# Patient Record
Sex: Male | Born: 1990 | Race: White | Hispanic: No | Marital: Single | Smoking: Never smoker
Health system: Southern US, Community
[De-identification: ages and names within clinical notes are randomized; demographics above are authoritative.]

## PROBLEM LIST (undated history)

## (undated) DIAGNOSIS — J302 Other seasonal allergic rhinitis: Secondary | ICD-10-CM

## (undated) DIAGNOSIS — I493 Ventricular premature depolarization: Principal | ICD-10-CM

## (undated) HISTORY — PX: WISDOM TOOTH EXTRACTION: SHX21

## (undated) HISTORY — DX: Ventricular premature depolarization: I49.3

## (undated) HISTORY — DX: Other seasonal allergic rhinitis: J30.2

## (undated) HISTORY — PX: APPENDECTOMY: SHX54

## (undated) HISTORY — PX: TONSILLECTOMY: SUR1361

---

## 2010-06-30 ENCOUNTER — Emergency Department: Payer: Self-pay | Admitting: Internal Medicine

## 2012-07-06 LAB — CBC
HCT: 46.4 % (ref 40.0–52.0)
HGB: 16.4 g/dL (ref 13.0–18.0)
MCH: 29.8 pg (ref 26.0–34.0)
MCHC: 35.3 g/dL (ref 32.0–36.0)
Platelet: 310 10*3/uL (ref 150–440)
RBC: 5.49 10*6/uL (ref 4.40–5.90)
RDW: 12.8 % (ref 11.5–14.5)
WBC: 5.7 10*3/uL (ref 3.8–10.6)

## 2012-07-06 LAB — COMPREHENSIVE METABOLIC PANEL
Albumin: 4.8 g/dL (ref 3.4–5.0)
Alkaline Phosphatase: 80 U/L (ref 50–136)
Anion Gap: 3 — ABNORMAL LOW (ref 7–16)
Bilirubin,Total: 1 mg/dL (ref 0.2–1.0)
Calcium, Total: 9.6 mg/dL (ref 8.5–10.1)
Creatinine: 0.99 mg/dL (ref 0.60–1.30)
EGFR (African American): >60
EGFR (Non-African Amer.): >60
Osmolality: 278 (ref 275–301)
Potassium: 4.1 mmol/L (ref 3.5–5.1)
SGPT (ALT): 23 U/L (ref 12–78)
Sodium: 139 mmol/L (ref 136–145)

## 2012-07-06 LAB — URINALYSIS, COMPLETE
Bacteria: NONE SEEN
Bilirubin,UR: NEGATIVE
Leukocyte Esterase: NEGATIVE
Nitrite: NEGATIVE
RBC,UR: 4 /HPF (ref 0–5)
Specific Gravity: 1.018 (ref 1.003–1.030)
Squamous Epithelial: NONE SEEN

## 2012-07-06 LAB — LIPASE, BLOOD: Lipase: 107 U/L (ref 73–393)

## 2012-07-07 ENCOUNTER — Observation Stay: Payer: Self-pay | Admitting: Surgery

## 2012-07-07 LAB — CBC WITH DIFFERENTIAL/PLATELET
Basophil #: 0.1 10*3/uL (ref 0.0–0.1)
Basophil %: 1.7 %
Eosinophil #: 0.1 10*3/uL (ref 0.0–0.7)
HCT: 39.1 % — ABNORMAL LOW (ref 40.0–52.0)
HGB: 14 g/dL (ref 13.0–18.0)
Lymphocyte #: 2.2 10*3/uL (ref 1.0–3.6)
Lymphocyte %: 35.9 %
MCV: 85 fL (ref 80–100)
Monocyte %: 6.1 %
Neutrophil #: 3.3 10*3/uL (ref 1.4–6.5)
Platelet: 240 10*3/uL (ref 150–440)
RBC: 4.61 10*6/uL (ref 4.40–5.90)
WBC: 6.1 10*3/uL (ref 3.8–10.6)

## 2013-09-09 ENCOUNTER — Emergency Department (HOSPITAL_COMMUNITY)
Admission: EM | Admit: 2013-09-09 | Discharge: 2013-09-09 | Disposition: A | Discharge status: Home / Self Care | Payer: BC Managed Care – PPO | Source: Home / Self Care

## 2013-09-09 ENCOUNTER — Encounter (HOSPITAL_COMMUNITY): Payer: Self-pay | Admitting: Emergency Medicine

## 2013-09-09 ENCOUNTER — Emergency Department (INDEPENDENT_AMBULATORY_CARE_PROVIDER_SITE_OTHER): Payer: BC Managed Care – PPO

## 2013-09-09 ENCOUNTER — Emergency Department (HOSPITAL_COMMUNITY): Payer: BC Managed Care – PPO

## 2013-09-09 DIAGNOSIS — M899 Disorder of bone, unspecified: Secondary | ICD-10-CM

## 2013-09-09 DIAGNOSIS — M949 Disorder of cartilage, unspecified: Secondary | ICD-10-CM

## 2013-09-09 DIAGNOSIS — M25512 Pain in left shoulder: Secondary | ICD-10-CM

## 2013-09-09 NOTE — Discharge Instructions (Signed)

## 2013-09-09 NOTE — ED Provider Notes (Signed)
CSN: [id number]     Arrival date & time 09/09/13  1156 History   First MD Initiated Contact with Patient 09/09/13 1231     Chief Complaint  Patient presents with  . Pain   (Consider location/radiation/quality/duration/timing/severity/associated sxs/prior Treatment) HPI Comments: 23 year old male complaining of pain to the left clavicle for approximately 3 weeks. This began a couple days after playing around the golf. He denies any known injury or trauma. Pain is exacerbated by placing weight on the left arm or pulling the left arm down. Denies pain to the a.c. joint. He points to an area across the mid clavicle. No pain with rotation of the head or neck.   History reviewed. No pertinent past medical history. No past surgical history on file. No family history on file. History  Substance Use Topics  . Smoking status: Not on file  . Smokeless tobacco: Not on file  . Alcohol Use: Not on file    Review of Systems  Constitutional: Negative.   Respiratory: Negative.   Gastrointestinal: Negative.   Genitourinary: Negative.   Musculoskeletal: Negative for back pain, gait problem, myalgias and neck pain.       As per HPI  Skin: Negative.   Neurological: Negative for dizziness, weakness, numbness and headaches.    Allergies  Review of patient's allergies indicates no known allergies.  Home Medications   Prior to Admission medications   Not on File   BP 129/89  Pulse 82  Temp(Src) 98 F (36.7 C) (Oral)  Resp 12  SpO2 99% Physical Exam  Nursing note and vitals reviewed. Constitutional: He is oriented to person, place, and time. He appears well-developed and well-nourished. No distress.  Eyes: Conjunctivae and EOM are normal.  Neck: Normal range of motion. Neck supple.  Cardiovascular: Normal rate and regular rhythm.   Pulmonary/Chest: Effort normal and breath sounds normal.  Musculoskeletal:  Minor redness and swelling across the mid clavicle. No direct bony tenderness or  percussion tenderness. No deformity. No signs of infection. Full range of motion of the left upper extremity. Distal neurovascular motor sensory of the left upper extremity is intact. Left radial pulse intact. No swelling or tenderness over the a.c. joint. Sternum intact without swelling, tenderness or discoloration. No chest tenderness. No pain with taking a deep breath.  Lymphadenopathy:    He has no cervical adenopathy.  Neurological: He is alert and oriented to person, place, and time. He exhibits normal muscle tone.  Skin: Skin is warm and dry.  Psychiatric: He has a normal mood and affect.    ED Course  Procedures (including critical care time) Labs Review Labs Reviewed - No data to display  Imaging Review Dg Clavicle Left  09/09/2013   CLINICAL DATA:  Left collarbone pain for 3 weeks, no known injury  EXAM: LEFT CLAVICLE - 2+ VIEWS  COMPARISON:  None.  FINDINGS: No fracture or dislocation. No definite aggressive osseous lesions. Limited visualization of the adjacent acromioclavicular joint is normal. Normal coracoclavicular distance is observed on both provided radiographs. Regional soft tissues appear normal. Limited visualization of the left lung apex is normal.  IMPRESSION: Normal radiographs of the left clavicle.   Electronically Signed   By: Simonne ComeJohn  Watts M.D.   On: 09/09/2013 13:05     MDM   1. Clavicle pain, left    Subclavius muscle strain/sprain; probably due to golf maneuvers Ice, sling for 3-4 days. Remove for stretching periodically. F/U with PCP as needed or sports medicine     Onalee Huaavid  Mabe, NP 09/09/13 1325  Hayden Rasmussenavid Mabe, NP 09/09/13 1338

## 2013-09-09 NOTE — ED Notes (Signed)
C/o collar bone pain for three weeks now States he was playing golf  Denies any injury States area is red

## 2013-09-09 NOTE — ED Provider Notes (Signed)
Medical screening examination/treatment/procedure(s) were performed by resident physician or non-physician practitioner and as supervising physician I was immediately available for consultation/collaboration.   KINDL,JAMES DOUGLAS MD.   James D Kindl, MD 09/09/13 1514 

## 2013-09-15 ENCOUNTER — Encounter: Payer: Self-pay | Admitting: Family Medicine

## 2013-09-15 ENCOUNTER — Ambulatory Visit (INDEPENDENT_AMBULATORY_CARE_PROVIDER_SITE_OTHER): Payer: BC Managed Care – PPO | Admitting: Family Medicine

## 2013-09-15 VITALS — BP 120/75 | HR 79 | Ht 71.0 in | Wt 165.0 lb

## 2013-09-15 DIAGNOSIS — M949 Disorder of cartilage, unspecified: Secondary | ICD-10-CM

## 2013-09-15 DIAGNOSIS — M25519 Pain in unspecified shoulder: Secondary | ICD-10-CM

## 2013-09-15 DIAGNOSIS — M899 Disorder of bone, unspecified: Secondary | ICD-10-CM

## 2013-09-15 DIAGNOSIS — M25512 Pain in left shoulder: Secondary | ICD-10-CM

## 2013-09-15 DIAGNOSIS — M25511 Pain in right shoulder: Secondary | ICD-10-CM

## 2013-09-15 NOTE — Patient Instructions (Signed)
Your history and exam are consistent with strain of your scalene muscles. Icing 15 minutes at a time 3-4 times a day as needed. Ok for activities as tolerated. Tylenol or aleve as needed for pain. Physical therapy is your best bet to recover from this - do home exercises and stretches they give you daily for next 6 weeks. Follow up with me in 6 weeks.

## 2013-09-17 ENCOUNTER — Encounter: Payer: Self-pay | Admitting: Family Medicine

## 2013-09-17 DIAGNOSIS — M25512 Pain in left shoulder: Secondary | ICD-10-CM | POA: Insufficient documentation

## 2013-09-17 NOTE — Progress Notes (Signed)
Patient ID: Bryan LoftsStephen Statzer, male   DOB: Sep 08, 1990, 23 y.o.   MRN: [id number]  PCP: No primary provider on file.  Subjective:   HPI: Patient is a 23 y.o. male here for left shoulder pain.  Patient reports he was playing golf 3 weeks ago. No known injury. A day or so after this he felt pain localized to deep to collarbone on left side. Is right handed. Some slight swelling. Feels pressure in this area with arm above head. Worse sleeping. No numbness or tingling. No radiation. Tried advil, icing. Slightly better over past week.  History reviewed. No pertinent past medical history.  No current outpatient prescriptions on file prior to visit.   No current facility-administered medications on file prior to visit.    Past Surgical History  Procedure Laterality Date  . Tonsillectomy    . Appendectomy      No Known Allergies  History   Social History  . Marital Status: Single    Spouse Name: N/A    Number of Children: N/A  . Years of Education: N/A   Occupational History  . Not on file.   Social History Main Topics  . Smoking status: Never Smoker   . Smokeless tobacco: Not on file  . Alcohol Use: Not on file  . Drug Use: Not on file  . Sexual Activity: Not on file   Other Topics Concern  . Not on file   Social History Narrative  . No narrative on file    No family history on file.  BP 120/75  Pulse 79  Ht 5\' 11"  (1.803 m)  Wt 165 lb (74.844 kg)  BMI 23.02 kg/m2  Review of Systems: See HPI above.    Objective:  Physical Exam:  Gen: NAD  Left shoulder: No swelling, ecchymoses.  No gross deformity. No TTP focally on left clavicle - tenderness posterior to this, in scalene area. FROM shoulder with pain in same area doing pushup motion, full abduction. Negative Hawkins, Neers. Negative Speeds, Yergasons. Strength 5/5 with empty can and resisted internal/external rotation. Negative apprehension. NV intact distally.    Assessment & Plan:  1. Left  shoulder pain - very unusual presentation.  No injury, no tenderness of clavicle - radiographs negative.  Suspect scalene muscle strain based on history and location, pain with shrug.  No symptoms into left upper extremity.  Will try physical therapy, icing, activity modification.  F/u in 6 weeks.

## 2013-09-17 NOTE — Assessment & Plan Note (Signed)
very unusual presentation.  No injury, no tenderness of clavicle - radiographs negative.  Suspect scalene muscle strain based on history and location, pain with shrug.  No symptoms into left upper extremity.  Will try physical therapy, icing, activity modification.  F/u in 6 weeks.

## 2013-10-09 ENCOUNTER — Encounter: Payer: Self-pay | Admitting: Physician Assistant

## 2013-10-09 ENCOUNTER — Ambulatory Visit (INDEPENDENT_AMBULATORY_CARE_PROVIDER_SITE_OTHER): Payer: BC Managed Care – PPO | Admitting: Physician Assistant

## 2013-10-09 VITALS — BP 102/76 | HR 103 | Temp 98.1°F | Resp 16 | Ht 71.0 in | Wt 159.8 lb

## 2013-10-09 DIAGNOSIS — Z113 Encounter for screening for infections with a predominantly sexual mode of transmission: Secondary | ICD-10-CM

## 2013-10-09 DIAGNOSIS — R002 Palpitations: Secondary | ICD-10-CM

## 2013-10-09 LAB — CBC
HCT: 40.7 % (ref 39.0–52.0)
HEMOGLOBIN: 14.7 g/dL (ref 13.0–17.0)
MCH: 29.3 pg (ref 26.0–34.0)
MCHC: 36.1 g/dL — ABNORMAL HIGH (ref 30.0–36.0)
MCV: 81.1 fL (ref 78.0–100.0)
Platelets: 240 10*3/uL (ref 150–400)
RBC: 5.02 MIL/uL (ref 4.22–5.81)
RDW: 13 % (ref 11.5–15.5)
WBC: 3.6 10*3/uL — ABNORMAL LOW (ref 4.0–10.5)

## 2013-10-09 LAB — COMPREHENSIVE METABOLIC PANEL
ALBUMIN: 4.7 g/dL (ref 3.5–5.2)
ALK PHOS: 37 U/L — AB (ref 39–117)
ALT: 11 U/L (ref 0–53)
AST: 20 U/L (ref 0–37)
BUN: 14 mg/dL (ref 6–23)
CO2: 29 mEq/L (ref 19–32)
CREATININE: 0.99 mg/dL (ref 0.50–1.35)
Calcium: 9.5 mg/dL (ref 8.4–10.5)
Chloride: 102 mEq/L (ref 96–112)
GLUCOSE: 95 mg/dL (ref 70–99)
Potassium: 4.3 mEq/L (ref 3.5–5.3)
Sodium: 140 mEq/L (ref 135–145)
Total Bilirubin: 0.8 mg/dL (ref 0.2–1.2)
Total Protein: 7.1 g/dL (ref 6.0–8.3)

## 2013-10-09 NOTE — Progress Notes (Signed)
Pre visit review using our clinic review tool, if applicable. No additional management support is needed unless otherwise documented below in the visit note/SLS  

## 2013-10-09 NOTE — Patient Instructions (Addendum)
Please obtain labs.  I will call you with your results.  Avoid caffeine.  You will be contacted by Cardiology for placement of a Holter Monitor.  We will call you once we have those results.  We will also get you in to see a Cardiologist for a consult.  Read information below on HPV.  I would still recommend the vaccination.  Human Papillomavirus Human papillomavirus (HPV) is the most common sexually transmitted infection (STI) and is highly contagious. HPV infections cause genital warts and cancers to the outlet of the womb (cervix), birth canal (vagina), opening of the birth canal (vulva), and anus. There are over 100 types of HPV. Four types of HPV are responsible for causing 70% of all cervical cancers. Ninety percent of anal cancers and genital warts are caused by HPV. Unless you have wart-like lesions in the throat or genital warts that you can see or feel, HPV usually does not cause symptoms. Therefore, people can be infected for long periods and pass it on to others without knowing it. HPV in pregnancy usually does not cause a problem for the mother or baby. If the mother has genital warts, the baby rarely gets infected. When the HPV infection is found to be pre-cancerous on the cervix, vagina, or vulva, the mother will be followed closely during the pregnancy. Any needed treatment will be done after the baby is born. CAUSES   Having unprotected sex. HPV can be spread by oral, vaginal, or anal sexual activity.  Having several sex partners.  Having a sex partner who has other sex partners.  Having or having had another sexually transmitted infection. SYMPTOMS   More than 90% of people carrying HPV cannot tell anything is wrong.  Wart-like lesions in the throat (from having oral sex).  Warts in the infected skin or mucous membranes.  Genital warts may itch, burn, or bleed.  Genital warts may be painful or bleed during sexual intercourse. DIAGNOSIS   Genital warts are easily seen  with the naked eye.  Currently, there is no FDA-approved test to detect HPV in males.  In females, a Pap test can show cells which are infected with HPV.  In females, a scope can be used to view the cervix (colposcopy). A colposcopy can be performed if the pelvic exam or Pap test is abnormal.  In females, a sample of tissue may be removed (biopsy) during the colposcopy. TREATMENT   Treatment of genital warts can include:  Podophyllin lotion or gel.  Bichloroacetic acid (BCA) or trichloroacetic acid (TCA).  Podofilox solution or gel.  Imiquimod cream.  Interferon injections.  Use of a probe to apply extreme cold (cryotherapy).  Application of an intense beam of light (laser treatment).  Use of a probe to apply extreme heat (electrocautery).  Surgery.  HPV of the cervix, vagina, or vulva can be treated with:  Cryotherapy.  Laser treatment.  Electrocautery.  Surgery. Your caregiver will follow you closely after you are treated. This is because the HPV can come back and may need treatment again. HOME CARE INSTRUCTIONS   Follow your caregiver's instructions regarding medications, Pap tests, and follow-up exams.  Do not touch or scratch the warts.  Do not treat genital warts with medication used for treating hand warts.  Tell your sex partner about your infection because he or she may also need treatment.  Do not have sex while you are being treated.  After treatment, use condoms during sex to prevent future infections.  Have only 1  sex partner.  Have a sex partner who does not have other sex partners.  Use over-the-counter creams for itching or irritation as directed by your caregiver.  Use over-the-counter or prescription medicines for pain, discomfort, or fever as directed by your caregiver.  Do not douche or use tampons during treatment of HPV. PREVENTION   Talk to your caregiver about getting the HPV vaccines. These vaccines prevent some HPV infections  and cancers. It is recommended that the vaccine be given to males and females between the age of 77 and 37 years old. It will not work if you already have HPV and it is not recommended for pregnant women. The vaccines are not recommended for pregnant women.  Call your caregiver if you think you are pregnant and have the HPV.  A PAP test is done to screen for cervical cancer.  The first PAP test should be done at age 74.  Between ages 88 and 59, PAP tests are repeated every 2 years.  Beginning at age 25, you are advised to have a PAP test every 3 years as long as your past 3 PAP tests have been normal.  Some women have medical problems that increase the chance of getting cervical cancer. Talk to your caregiver about these problems. It is especially important to talk to your caregiver if a new problem develops soon after your last PAP test. In these cases, your caregiver may recommend more frequent screening and Pap tests.  The above recommendations are the same for women who have or have not gotten the vaccine for HPV (Human Papillomavirus).  If you had a hysterectomy for a problem that was not a cancer or a condition that could lead to cancer, then you no longer need Pap tests. However, even if you no longer need a PAP test, a regular exam is a good idea to make sure no other problems are starting.   If you are between ages 69 and 84, and you have had normal Pap tests going back 10 years, you no longer need Pap tests. However, even if you no longer need a PAP test, a regular exam is a good idea to make sure no other problems are starting.  If you have had past treatment for cervical cancer or a condition that could lead to cancer, you need Pap tests and screening for cancer for at least 20 years after your treatment.  If Pap tests have been discontinued, risk factors (such as a new sexual partner)need to be re-assessed to determine if screening should be resumed.  Some women may need  screenings more often if they are at high risk for cervical cancer. SEEK MEDICAL CARE IF:   The treated skin becomes red, swollen or painful.  You have an oral temperature above 102 F (38.9 C).  You feel generally ill.  You feel lumps or pimple-like projections in and around your genital area.  You develop bleeding of the vagina or the treatment area.  You develop painful sexual intercourse. Document Released: 05/27/2003 Document Revised: 05/29/2011 Document Reviewed: 06/11/2013 [facility] Patient Information 2015 [place], [place]. This information is not intended to replace advice given to you by your health care provider. Make sure you discuss any questions you have with your health care provider.

## 2013-10-09 NOTE — Assessment & Plan Note (Signed)
Will obtain routine STI labs for STI screening.  Physical exam unremarkable.

## 2013-10-09 NOTE — Assessment & Plan Note (Signed)
Long-standing history.  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  Will refer to Cardiology.  Will also set up Holter Monitor placement.  Will obtain labs to include CBC, BMP, TSH and T4.  Avoid caffeine.  If symptoms recur and are associated with SOB or chest pain, patient to proceed to ER for acute evaluation.

## 2013-10-09 NOTE — Progress Notes (Signed)
Patient presents to clinic today to establish care.  Acute Concerns: Patient complains of intermittent palpitations that have been occurring every few months for at least 5 years.  Has never had a formal evaluation.  Endorses racing heart beat and fluttering that lasts for several minutes and is associated with SOB.  Denies chest pain, nausea or vomiting.  Denies lightheadedness, dizziness or syncope.  Denies hx of cardiac disease.  States he has an identical twin brother who suffers the same symptoms.  Denies history of anxiety.  Symptoms seem more prevalent when patient is low on sleep.   Patient also requesting routine STD screening.  Is sexually active with women only.  Endorses monogamous relationship with his girlfriend.  Does not use condoms.   Has no concern for current STD.  Denies hx of STD.   Past Medical History  Diagnosis Date  . Seasonal allergies     Past Surgical History  Procedure Laterality Date  . Tonsillectomy    . Appendectomy    . Wisdom tooth extraction      No current outpatient prescriptions on file prior to visit.   No current facility-administered medications on file prior to visit.    Allergies  Allergen Reactions  . Prednisone     SOB    Family History  Problem Relation Age of Onset  . Healthy Mother   . Healthy Father   . Diabetes Maternal Grandmother   . Cancer Maternal Grandmother     lung  . Diabetes Maternal Aunt   . Diabetes Maternal Uncle   . Healthy Brother     x1 Twin    History   Social History  . Marital Status: Single    Spouse Name: N/A    Number of Children: N/A  . Years of Education: N/A   Occupational History  . Not on file.   Social History Main Topics  . Smoking status: Never Smoker   . Smokeless tobacco: Never Used  . Alcohol Use: 3.0 oz/week    5 Cans of beer per week  . Drug Use: Yes    Special: Marijuana     Comment: rare  . Sexual Activity: Yes     Comment: women - partner uses OCPs   Other Topics  Concern  . Not on file   Social History Narrative  . No narrative on file   ROS See HPI.  All other ROS are negative.  BP 102/76  Pulse 103  Temp(Src) 98.1 F (36.7 C) (Oral)  Resp 16  Ht 5\' 11"  (1.803 m)  Wt 159 lb 12 oz (72.462 kg)  BMI 22.29 kg/m2  SpO2 98%  Physical Exam  Vitals reviewed. Constitutional: He is oriented to person, place, and time and well-developed, well-nourished, and in no distress.  HENT:  Head: Normocephalic and atraumatic.  Right Ear: External ear normal.  Left Ear: External ear normal.  Nose: Nose normal.  Mouth/Throat: Oropharynx is clear and moist. No oropharyngeal exudate.  Eyes: Conjunctivae are normal. Pupils are equal, round, and reactive to light.  Neck: Neck supple. No thyromegaly present.  Cardiovascular: Normal rate, regular rhythm, normal heart sounds and intact distal pulses.   Pulmonary/Chest: Effort normal and breath sounds normal. No respiratory distress. He has no wheezes. He has no rales. He exhibits no tenderness.  Genitourinary: Testes/scrotum normal and penis normal. No discharge found.  Neurological: He is alert and oriented to person, place, and time.  Skin: Skin is warm and dry. No rash noted.  Psychiatric: Affect  normal.   Assessment/Plan: Palpitations Long-standing history.  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  Will refer to Cardiology.  Will also set up Holter Monitor placement.  Will obtain labs to include CBC, BMP, TSH and T4.  Avoid caffeine.  If symptoms recur and are associated with SOB or chest pain, patient to proceed to ER for acute evaluation.   Screening for STD (sexually transmitted disease) Will obtain routine STI labs for STI screening.  Physical exam unremarkable.

## 2013-10-10 LAB — GC/CHLAMYDIA PROBE AMP, URINE
CHLAMYDIA, SWAB/URINE, PCR: NEGATIVE
GC PROBE AMP, URINE: NEGATIVE

## 2013-10-10 LAB — HIV ANTIBODY (ROUTINE TESTING W REFLEX): HIV 1&2 Ab, 4th Generation: NONREACTIVE

## 2013-10-10 LAB — TSH: TSH: 1.56 u[IU]/mL (ref 0.350–4.500)

## 2013-10-10 LAB — T4: T4, Total: 9 ug/dL (ref 5.0–12.5)

## 2013-10-10 LAB — RPR

## 2013-10-13 LAB — HSV(HERPES SMPLX)ABS-I+II(IGG+IGM)-BLD
HSV 1 Glycoprotein G Ab, IgG: <0.1 IV
Herpes Simplex Vrs I&II-IgM Ab (EIA): 0.14 INDEX

## 2013-10-16 ENCOUNTER — Encounter (INDEPENDENT_AMBULATORY_CARE_PROVIDER_SITE_OTHER): Payer: BC Managed Care – PPO

## 2013-10-16 ENCOUNTER — Encounter: Payer: Self-pay | Admitting: *Deleted

## 2013-10-16 DIAGNOSIS — R002 Palpitations: Secondary | ICD-10-CM

## 2013-10-16 NOTE — Progress Notes (Signed)
Patient ID: Bryan LoftsStephen Christman, male   DOB: Oct 29, 1990, 23 y.o.   MRN: [id number] [facility] 24 hour holter monitor applied to patient.

## 2013-10-27 ENCOUNTER — Ambulatory Visit: Payer: BC Managed Care – PPO | Admitting: Family Medicine

## 2013-10-30 ENCOUNTER — Telehealth: Payer: Self-pay | Admitting: Physician Assistant

## 2013-10-30 NOTE — Telephone Encounter (Signed)
Holter Monitor results are in. I am not sure if Cardiology called patient with results are not.  He had a couple of episodes of sinus arrhythmia that correspond to his symptoms. I want him to take it easy. I am having the referral coordinator see if Cardiology can move up his appointment to a sooner date.   Candise BowensJen can you contact Cardiology and see about getting the patient a sooner appointment as his Holter Monitor results show correlation between findings and patients symptoms.

## 2013-10-31 NOTE — Telephone Encounter (Signed)
If there is nothing else available then that will be fine.

## 2013-10-31 NOTE — Telephone Encounter (Signed)
He has appt on 8/27, is that to far out?

## 2013-11-13 ENCOUNTER — Ambulatory Visit (INDEPENDENT_AMBULATORY_CARE_PROVIDER_SITE_OTHER): Payer: BC Managed Care – PPO | Admitting: Cardiovascular Disease

## 2013-11-13 ENCOUNTER — Encounter: Payer: Self-pay | Admitting: Cardiovascular Disease

## 2013-11-13 VITALS — BP 100/68 | HR 76 | Resp 16 | Ht 71.0 in | Wt 160.0 lb

## 2013-11-13 DIAGNOSIS — R0602 Shortness of breath: Secondary | ICD-10-CM

## 2013-11-13 MED ORDER — METOPROLOL SUCCINATE ER 25 MG PO TB24: 12.5000 mg | ORAL_TABLET | Freq: Every day | ORAL | Status: DC

## 2013-11-13 NOTE — Patient Instructions (Signed)
Your physician has requested that you have an echocardiogram. Echocardiography is a painless test that uses sound waves to create images of your heart. It provides your doctor with information about the size and shape of your heart and how well your heart's chambers and valves are working. This procedure takes approximately one hour. There are no restrictions for this procedure.  START Metoprolol ER  tablet - take 1/2 tablet daily.  Dr. Royann Shivers recommends that you schedule a follow-up appointment in: 3-4 months

## 2013-11-13 NOTE — Progress Notes (Signed)
Patient ID: Bryan Carrillo, male   DOB: 1990/06/01, 23 y.o.   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 They are described as frequent, but isolated thumping sensation in his chest associated with a sensation of breathlessness. Earlier in the year, during tax season he noticed they were particularly frequent and bothersome. He noticed a definite association between stress at work and the prevalence of the palpitations. Around that time he was often staying up until 3:00 in the morning to finish his increased work load. He has also noticed that they're more common when he increases the intake of caffeine or after consuming alcohol the night before. When the palpitations were very frequent in February and March they associated a sensation of constant chest pain. The palpitations occur randomly throughout the day but are never sustained. He has not had syncope or presyncope. He is a runner, although he does not exercise as often as he used to in the past. Nevertheless at least a couple of times a week he will run 2 x 8 minute miles. He does not experience the palpitations during exercise or immediately following exercise.  A 24-hour Holter monitor shows a high burden of isolated PVCs. PVCs represent roughly 7% of all beats. There was no evidence of couplets or ventricular tachycardia. He had normal circadian rhythm variation and profound sinus arrhythmia of youth. Of note today that he wore the monitor was not a day with prominent palpitations.   Allergies  Allergen Reactions  . Prednisone     SOB    Current Outpatient Prescriptions  Medication Sig Dispense Refill  . Multiple Vitamin (MULTIVITAMIN) tablet Take 1 tablet by mouth daily.      .       No current facility-administered medications for this visit.    Past Medical History  Diagnosis Date    . Seasonal allergies     Past Surgical History  Procedure Laterality Date  . Tonsillectomy    . Appendectomy    . Wisdom tooth extraction      Family History  Problem Relation Age of Onset  . Healthy Mother   . Healthy Father   . Diabetes Maternal Grandmother   . Cancer Maternal Grandmother     lung  . Diabetes Maternal Aunt   . Diabetes Maternal Uncle   . Healthy Brother     x1 Twin    History   Social History  . Marital Status: Single    Spouse Name: N/A    Number of Children: N/A  . Years of Education: N/A   Occupational History  . Not on file.   Social History Main Topics  . Smoking status: Never Smoker   . Smokeless tobacco: Never Used  . Alcohol Use: 3.0 oz/week    5 Cans of beer per week  . Drug Use: Yes    Special: Marijuana     Comment: rare  . Sexual Activity: Yes     Comment: women - partner uses OCPs   Other Topics Concern  . Not on file   Social History Narrative  . No narrative on file    Review of systems: The patient specifically denies any chest painwith exertion, dyspnea with exertion, orthopnea, paroxysmal nocturnal dyspnea, syncope, palpitations, focal neurological deficits, intermittent claudication, lower extremity edema, unexplained weight gain, cough, hemoptysis or wheezing.  The patient also denies  abdominal pain, nausea, vomiting, dysphagia, diarrhea, constipation, polyuria, polydipsia, dysuria, hematuria, frequency, urgency, abnormal bleeding or bruising, fever, chills, unexpected weight changes, mood swings, change in skin or hair texture, change in voice quality, auditory or visual problems, allergic reactions or rashes, new musculoskeletal complaints other than usual "aches and pains".   PHYSICAL EXAM BP 100/68  Pulse 76  Ht  (1.803 m)  Wt 160 lb (72.576 kg)  BMI 22.33 kg/m2  General: Alert, oriented x3, no distress Head: no evidence of trauma, PERRL, EOMI, no exophtalmos or lid lag, no myxedema, no xanthelasma;  normal ears, nose and oropharynx Neck: normal jugular venous pulsations and no hepatojugular reflux; brisk carotid pulses without delay and no carotid bruits Chest: clear to auscultation, no signs of consolidation by percussion or palpation, normal fremitus, symmetrical and full respiratory excursions Cardiovascular: normal position and quality of the apical impulse, regular rhythm with very prominent respiratory arrhythmia, normal first and second heart sounds, no murmurs, rubs or gallops Abdomen: no tenderness or distention, no masses by palpation, no abnormal pulsatility or arterial bruits, normal bowel sounds, no hepatosplenomegaly Extremities: no clubbing, cyanosis or edema; 2+ radial, ulnar and brachial pulses bilaterally; 2+ right femoral, posterior tibial and dorsalis pedis pulses; 2+ left femoral, posterior tibial and dorsalis pedis pulses; no subclavian or femoral bruits Neurological: grossly nonfocal   EKG: Sinus rhythm with respiratory arrhythmia, frequent PVCs, normal QTC (396 ms), no epsilon wave, no repolarization abnormalities  BMET    Component Value Date/Time   NA 140 10/09/2013 0935   K 4.3 10/09/2013 0935   CL 102 10/09/2013 0935   CO2 29 10/09/2013 0935   GLUCOSE 95 10/09/2013 0935   BUN 14 10/09/2013 0935   CREATININE 0.99 10/09/2013 0935   CALCIUM 9.5 10/09/2013 0935     ASSESSMENT AND PLAN  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 He does have some associated shortness of breath and chest discomfort, but these occur at rest when the arrhythmia is prevalent and do not occur during more intense physical exercise. He does not have high risk features on the electrocardiogram. We'll check an echocardiogram primarily for his symptoms of dyspnea and chest pain. Consider empirical therapy with beta blockers, but the dose will have to be very low since he tends to run a low normal blood pressure.  It is probably a good idea to do a "trial run" of beta blocker therapy now, to know that this will be a useful intervention next spring, when his symptoms are likely to worsen under the pressure of increased work load  Orders Placed This Encounter  Procedures  . 2D Echocardiogram without contrast   Meds ordered this encounter  Medications  . metoprolol succinate (TOPROL-XL) 25 MG 24 hr tablet    Sig: Take 0.5 tablets (12.5 mg total) by mouth daily.    Dispense:  15 tablet    Refill:  5    Lakara Weiland  Thurmon Fair, MD, [facility] [phone] office [phone] pager

## 2013-11-19 ENCOUNTER — Ambulatory Visit (HOSPITAL_COMMUNITY)
Admission: RE | Admit: 2013-11-19 | Discharge: 2013-11-19 | Disposition: A | Discharge status: Home / Self Care | Payer: BC Managed Care – PPO | Source: Ambulatory Visit | Attending: Cardiovascular Disease | Admitting: Cardiovascular Disease

## 2013-11-19 DIAGNOSIS — I359 Nonrheumatic aortic valve disorder, unspecified: Secondary | ICD-10-CM | POA: Diagnosis not present

## 2013-11-19 DIAGNOSIS — R0989 Other specified symptoms and signs involving the circulatory and respiratory systems: Secondary | ICD-10-CM | POA: Diagnosis not present

## 2013-11-19 DIAGNOSIS — I4949 Other premature depolarization: Secondary | ICD-10-CM

## 2013-11-19 DIAGNOSIS — R0609 Other forms of dyspnea: Secondary | ICD-10-CM | POA: Diagnosis not present

## 2013-11-19 DIAGNOSIS — R0602 Shortness of breath: Secondary | ICD-10-CM | POA: Insufficient documentation

## 2013-11-19 DIAGNOSIS — R002 Palpitations: Secondary | ICD-10-CM

## 2013-11-19 NOTE — Progress Notes (Signed)
2D Echocardiogram Complete.  11/19/2013   Breckyn Ticas, RDCS  

## 2014-01-13 ENCOUNTER — Ambulatory Visit (INDEPENDENT_AMBULATORY_CARE_PROVIDER_SITE_OTHER): Payer: BC Managed Care – PPO | Admitting: Physician Assistant

## 2014-01-13 ENCOUNTER — Encounter: Payer: Self-pay | Admitting: Physician Assistant

## 2014-01-13 VITALS — BP 118/70 | HR 81 | Temp 98.1°F | Resp 16 | Ht 71.0 in | Wt 160.0 lb

## 2014-01-13 DIAGNOSIS — J0100 Acute maxillary sinusitis, unspecified: Secondary | ICD-10-CM

## 2014-01-13 MED ORDER — AZITHROMYCIN 250 MG PO TABS: ORAL_TABLET | ORAL | Status: DC

## 2014-01-13 NOTE — Patient Instructions (Signed)
Please take antibiotic as directed.  Increase fluid intake.  Use Saline nasal spray.  Take a daily multivitamin. Continue Mucinex. Place a humidifier in the bedroom.  Please call or return clinic if symptoms are not improving.  Sinusitis Sinusitis is redness, soreness, and swelling (inflammation) of the paranasal sinuses. Paranasal sinuses are air pockets within the bones of your face (beneath the eyes, the middle of the forehead, or above the eyes). In healthy paranasal sinuses, mucus is able to drain out, and air is able to circulate through them by way of your nose. However, when your paranasal sinuses are inflamed, mucus and air can become trapped. This can allow bacteria and other germs to grow and cause infection. Sinusitis can develop quickly and last only a short time (acute) or continue over a long period (chronic). Sinusitis that lasts for more than 12 weeks is considered chronic.  CAUSES  Causes of sinusitis include:  Allergies.  Structural abnormalities, such as displacement of the cartilage that separates your nostrils (deviated septum), which can decrease the air flow through your nose and sinuses and affect sinus drainage.  Functional abnormalities, such as when the small hairs (cilia) that line your sinuses and help remove mucus do not work properly or are not present. SYMPTOMS  Symptoms of acute and chronic sinusitis are the same. The primary symptoms are pain and pressure around the affected sinuses. Other symptoms include:  Upper toothache.  Earache.  Headache.  Bad breath.  Decreased sense of smell and taste.  A cough, which worsens when you are lying flat.  Fatigue.  Fever.  Thick drainage from your nose, which often is green and may contain pus (purulent).  Swelling and warmth over the affected sinuses. DIAGNOSIS  Your caregiver will perform a physical exam. During the exam, your caregiver may:  Look in your nose for signs of abnormal growths in your  nostrils (nasal polyps).  Tap over the affected sinus to check for signs of infection.  View the inside of your sinuses (endoscopy) with a special imaging device with a light attached (endoscope), which is inserted into your sinuses. If your caregiver suspects that you have chronic sinusitis, one or more of the following tests may be recommended:  Allergy tests.  Nasal culture A sample of mucus is taken from your nose and sent to a lab and screened for bacteria.  Nasal cytology A sample of mucus is taken from your nose and examined by your caregiver to determine if your sinusitis is related to an allergy. TREATMENT  Most cases of acute sinusitis are related to a viral infection and will resolve on their own within 10 days. Sometimes medicines are prescribed to help relieve symptoms (pain medicine, decongestants, nasal steroid sprays, or saline sprays).  However, for sinusitis related to a bacterial infection, your caregiver will prescribe antibiotic medicines. These are medicines that will help kill the bacteria causing the infection.  Rarely, sinusitis is caused by a fungal infection. In theses cases, your caregiver will prescribe antifungal medicine. For some cases of chronic sinusitis, surgery is needed. Generally, these are cases in which sinusitis recurs more than 3 times per year, despite other treatments. HOME CARE INSTRUCTIONS   Drink plenty of water. Water helps thin the mucus so your sinuses can drain more easily.  Use a humidifier.  Inhale steam 3 to 4 times a day (for example, sit in the bathroom with the shower running).  Apply a warm, moist washcloth to your face 3 to 4 times a   day, or as directed by your caregiver.  Use saline nasal sprays to help moisten and clean your sinuses.  Take over-the-counter or prescription medicines for pain, discomfort, or fever only as directed by your caregiver. SEEK IMMEDIATE MEDICAL CARE IF:  You have increasing pain or severe  headaches.  You have nausea, vomiting, or drowsiness.  You have swelling around your face.  You have vision problems.  You have a stiff neck.  You have difficulty breathing. MAKE SURE YOU:   Understand these instructions.  Will watch your condition.  Will get help right away if you are not doing well or get worse. Document Released: 03/06/2005 Document Revised: 05/29/2011 Document Reviewed: 03/21/2011 [employer] Patient Information 2014 [employer].   

## 2014-01-13 NOTE — Progress Notes (Signed)
Pre visit review using our clinic review tool, if applicable. No additional management support is needed unless otherwise documented below in the visit note/SLS  

## 2014-01-13 NOTE — Assessment & Plan Note (Signed)
Rx Azithromycin.  Increase fluids.  Saline nasal spray. Mucinex.  Humidifier in bedroom.

## 2014-01-13 NOTE — Progress Notes (Signed)
   Patient presents to clinic today c/o sinus pressure, sinus pain, headaches, productive cough and sore throat x 3.5 weeks. Patient denies fever, chills, shortness of breath.  Endorses some mild tooth discomfort.  Denies ear pain.  Past Medical History  Diagnosis Date  . Seasonal allergies     Current Outpatient Prescriptions on File Prior to Visit  Medication Sig Dispense Refill  . metoprolol succinate (TOPROL-XL) 25 MG 24 hr tablet Take 0.5 tablets (12.5 mg total) by mouth daily.  15 tablet  5  . Multiple Vitamin (MULTIVITAMIN) tablet Take 1 tablet by mouth daily.       No current facility-administered medications on file prior to visit.    Allergies  Allergen Reactions  . Prednisone     SOB    Family History  Problem Relation Age of Onset  . Healthy Mother   . Healthy Father   . Diabetes Maternal Grandmother   . Cancer Maternal Grandmother     lung  . Diabetes Maternal Aunt   . Diabetes Maternal Uncle   . Healthy Brother     x1 Twin    History   Social History  . Marital Status: Single    Spouse Name: N/A    Number of Children: N/A  . Years of Education: N/A   Social History Main Topics  . Smoking status: Never Smoker   . Smokeless tobacco: Never Used  . Alcohol Use: 3.0 oz/week    5 Cans of beer per week  . Drug Use: Yes    Special: Marijuana     Comment: rare  . Sexual Activity: Yes     Comment: women - partner uses OCPs   Other Topics Concern  . None   Social History Narrative  . None   Review of Systems - See HPI.  All other ROS are negative.  BP 118/70  Pulse 81  Temp(Src) 98.1 F (36.7 C) (Oral)  Resp 16  Ht 5\' 11"  (1.803 m)  Wt 160 lb (72.576 kg)  BMI 22.33 kg/m2  SpO2 100%  Physical Exam  Vitals reviewed. Constitutional: He is oriented to person, place, and time and well-developed, well-nourished, and in no distress.  HENT:  Head: Normocephalic and atraumatic.  Right Ear: Tympanic membrane, external ear and ear canal normal.    Left Ear: Tympanic membrane, external ear and ear canal normal.  Nose: Mucosal edema present. Right sinus exhibits maxillary sinus tenderness. Right sinus exhibits no frontal sinus tenderness. Left sinus exhibits maxillary sinus tenderness. Left sinus exhibits no frontal sinus tenderness.  Mouth/Throat: Uvula is midline, oropharynx is clear and moist and mucous membranes are normal.  Eyes: Conjunctivae are normal.  Neck: Neck supple.  Cardiovascular: Normal rate, regular rhythm, normal heart sounds and intact distal pulses.   Pulmonary/Chest: Effort normal and breath sounds normal. No respiratory distress. He has no rales. He exhibits no tenderness.  Lymphadenopathy:    He has no cervical adenopathy.  Neurological: He is alert and oriented to person, place, and time.  Skin: Skin is warm and dry. No rash noted.  Psychiatric: Affect normal.   No results found for this or any previous visit (from the past 2160 hour(s)).  Assessment/Plan: Acute maxillary sinusitis Rx Azithromycin.  Increase fluids.  Saline nasal spray. Mucinex.  Humidifier in bedroom.

## 2014-02-18 ENCOUNTER — Ambulatory Visit: Payer: BC Managed Care – PPO | Admitting: Cardiovascular Disease

## 2014-04-13 ENCOUNTER — Ambulatory Visit: Payer: BC Managed Care – PPO | Admitting: Cardiovascular Disease

## 2014-04-13 ENCOUNTER — Telehealth: Payer: Self-pay | Admitting: Cardiovascular Disease

## 2014-04-15 NOTE — Telephone Encounter (Signed)
Closed encounter °

## 2014-04-23 ENCOUNTER — Encounter: Payer: Self-pay | Admitting: Cardiology

## 2014-04-23 ENCOUNTER — Ambulatory Visit (INDEPENDENT_AMBULATORY_CARE_PROVIDER_SITE_OTHER): Payer: Managed Care, Other (non HMO) | Admitting: Cardiology

## 2014-04-23 VITALS — BP 118/64 | HR 88 | Ht 71.0 in | Wt 160.4 lb

## 2014-04-23 DIAGNOSIS — R079 Chest pain, unspecified: Secondary | ICD-10-CM

## 2014-04-23 MED ORDER — METOPROLOL SUCCINATE ER 25 MG PO TB24: 25.0000 mg | ORAL_TABLET | Freq: Every day | ORAL | Status: AC

## 2014-04-23 NOTE — Patient Instructions (Addendum)
Increase metoprolol to whole tablet daily. Call if you become lightheaded or dizzy and we would decrease back to half a tablet a day.  But the increase may help you prevent the chest pain.      Your physician recommends that you schedule a follow-up appointment in: 4 Weeks with Dr Royann Shiversroitoru  Your physician has recommended you make the following change in your medication: Increase Metoprolol to 25 mg daily, Do not take Sudafed

## 2014-04-23 NOTE — Progress Notes (Signed)
04/24/2014   PCP: Piedad ClimesMartin, William Cody, PA-C   Chief Complaint  Patient presents with  . Chest Pain    follow-up, pt c/o chest pain     Primary Cardiologist:Dr. Ruby ColaM. 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 They are described as frequent, but isolated thumping sensation in his chest associated with a sensation of breathlessness. Earlier in the year, during tax season he noticed they were particularly frequent and bothersome. He noticed a definite association between stress at work and the prevalence of the palpitations. Around that time he was often staying up until 3:00 in the morning to finish his increased work load. He has also noticed that they're more common when he increased the intake of caffeine or after consuming alcohol the night before. When the palpitations were very frequent in February and March last year they associated a sensation of constant chest pain. The palpitations occur randomly throughout the day but are never sustained. He has not had syncope or presyncope. He is a runner, although he does not exercise as often as he used to in the past. Nevertheless at least a couple of times a week he will run 2 x 8 minute miles. He does not experience the palpitations during exercise or immediately following exercise.  A 24-hour Holter monitor showed a high burden of isolated PVCs. PVCs represent roughly 7% of all beats. There was no evidence of couplets or ventricular tachycardia. He had normal circadian rhythm variation and profound sinus arrhythmia of youth. Of note today that he wore the monitor was not a day with prominent palpitations.    He is back today for follow-up. He has increased chest pain and has just started the metoprolol perhaps 2 weeks ago. Reading Dr. Ruby ColaM. Croitoru 's note, he mentions that the pain most likely would return during the spring of this year.   Patient has completely stopped caffeine.  He has no problems with exercise as above.  He has less pain and less problems with exercise.    Echocardiogram done last year was completely normal.   Allergies  Allergen Reactions  . Prednisone     SOB    Current Outpatient Prescriptions  Medication Sig Dispense Refill  . metoprolol succinate (TOPROL-XL) 25 MG 24 hr tablet Take 1 tablet (25 mg total) by mouth daily. 30 tablet 5  . Multiple Vitamin (MULTIVITAMIN) tablet Take 1 tablet by mouth daily.     No current facility-administered medications for this visit.    Past Medical History  Diagnosis Date  . Seasonal allergies     Past Surgical History  Procedure Laterality Date  . Tonsillectomy    . Appendectomy    . Wisdom tooth extraction      OZH:[id number]:ONROS:General:no colds or fevers, no weight changes Skin:no rashes or ulcers HEENT:no blurred vision, no congestion CV:see HPI PUL:see HPI GI:no diarrhea constipation or melena, no indigestion GU:no hematuria, no dysuria MS:no joint pain, no claudication Neuro:no syncope, no lightheadedness Endo:no diabetes, no thyroid disease  Wt Readings from Last 3 Encounters:  04/23/14 160 lb 6.4 oz (72.757 kg)  01/13/14 160 lb (72.576 kg)  11/13/13 160 lb (72.576 kg)    PHYSICAL EXAM BP 118/64 mmHg  Pulse 88  Ht 5\' 11"  (1.803 m)  Wt 160 lb 6.4 oz (72.757 kg)  BMI 22.38 kg/m2 General:Pleasant affect, NAD Skin:Warm and dry, brisk capillary refill HEENT:normocephalic,  sclera clear, mucus membranes moist Neck:supple, no JVD, no bruits  Heart:S1S2 RRR without murmur, gallup, rub or click Lungs:clear without rales, rhonchi, or wheezes WUJ:WJXB, non tender, + BS, do not palpate liver spleen or masses Ext:no lower ext edema, 2+ pedal pulses, 2+ radial pulses Neuro:alert and oriented, MAE, follows commands, + facial symmetry EKG: EKG was ordered today which demonstrated:  SR sinus arrhthymias with PVC no acute changes from previous  EKG  ASSESSMENT AND PLAN Symptomatic PVCs-  all increase the metoprolol to 25 mg daily if he starts feeling more fatigued are not as comfortable taking the 25 we can cut back but for now 25 mg daily he understands the reasoning behind increasing it to prevent further chest pain once he's through his high stress season we may certainly be able to decrease 12 and half milligrams daily. I will have him follow-up with Dr. Royann Shivers in 4 weeks just ensure the pain is resolved.

## 2014-04-24 ENCOUNTER — Encounter: Payer: Self-pay | Admitting: Cardiology

## 2014-05-22 ENCOUNTER — Encounter: Payer: Self-pay | Admitting: Cardiovascular Disease

## 2014-05-22 ENCOUNTER — Ambulatory Visit (INDEPENDENT_AMBULATORY_CARE_PROVIDER_SITE_OTHER): Payer: Managed Care, Other (non HMO) | Admitting: Cardiovascular Disease

## 2014-05-22 VITALS — BP 94/66 | HR 68 | Resp 16 | Ht 71.0 in | Wt 161.5 lb

## 2014-05-22 DIAGNOSIS — I493 Ventricular premature depolarization: Secondary | ICD-10-CM

## 2014-05-22 HISTORY — DX: Ventricular premature depolarization: I49.3

## 2014-05-22 NOTE — Progress Notes (Signed)
Patient ID: Bryan LoftsStephen Cuthrell, male   DOB: 06/25/1990, 24 y.o.   MRN: [id number]     Cardiology Office Note   Date:  05/22/2014   ID:  Bryan LoftsStephen Gundlach, DOB 06/25/1990, MRN [id number]  PCP:  Piedad ClimesMartin, William Cody, PA-C  Cardiologist:    Thurmon FairROITORU,Ngai Parcell, MD   Chief Complaint  Patient presents with  . Follow-up    4 WEEKS:  Symptoms of chest discomfort are better since increasing Metoprolol.      History of Present Illness: Bryan Carrillo is a 24 y.o. male who presents for follow-up for frequent symptomatic PVCs. He perceives these as chest pain. They are more frequent during the busy tax season when he sleeps less and drinks more coffee and are adequately suppressed with beta blockers and a very low dose. He feels great. He denies any recent problems. He has never experienced syncope and continues to run 2-3 times a week, 28 minute miles. Previous workup has shown no evidence of structural heart disease. His burden of PVCs is fairly high roughly 7% of all QRS is recorded in a 24-hour Holter. He is not having any side effects from the current dose of beta blocker.  He will be relocating to [place] in about 3 months.    Past Medical History  Diagnosis Date  . Seasonal allergies   . 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 Tonsillectomy    . Appendectomy    . Wisdom tooth extraction       Current Outpatient Prescriptions  Medication Sig Dispense Refill  . metoprolol succinate (TOPROL-XL) 25 MG 24 hr tablet Take 1 tablet (25 mg total) by mouth daily. 30 tablet 5  . Multiple Vitamin (MULTIVITAMIN) tablet Take 1 tablet by mouth daily.     No current facility-administered medications for this visit.    Allergies:   Prednisone    Social History:  The patient  reports that he has never smoked. He has never used smokeless tobacco. He reports that he drinks about 3.0 oz of alcohol per week. He reports that he uses illicit drugs (Marijuana).    Family History:  The patient's family history includes Cancer in his maternal grandmother; Diabetes in his maternal aunt, maternal grandmother, and maternal uncle; Healthy in his brother, father, and mother.    ROS:  Please see the history of present illness.   Otherwise, review of systems are positive for none.   All other systems are reviewed and negative.   The patient specifically denies any chest pain at rest or with exertion, dyspnea at rest or with exertion, orthopnea, paroxysmal nocturnal dyspnea, syncope, palpitations, focal neurological deficits, intermittent claudication, lower extremity edema, unexplained weight gain, cough, hemoptysis or wheezing.  The patient also denies abdominal pain, nausea, vomiting, dysphagia, diarrhea, constipation, polyuria, polydipsia, dysuria, hematuria, frequency, urgency, abnormal bleeding or bruising, fever, chills, unexpected weight changes, mood swings, change in skin or hair texture, change in voice quality, auditory or visual problems, allergic reactions or rashes, new musculoskeletal complaints other than usual "aches and pains".    PHYSICAL EXAM: VS:  BP 94/66 mmHg  Pulse 68  Resp 16  Ht 5\' 11"  (1.803 m)  Wt 161 lb 8 oz (73.256 kg)  BMI 22.53 kg/m2 , BMI Body mass index is 22.53 kg/(m^2).  General: Alert, oriented x3, no distress Head: no evidence of trauma, PERRL, EOMI, no exophtalmos or lid lag, no myxedema, no xanthelasma; normal ears, nose and oropharynx Neck: normal jugular venous  pulsations and no hepatojugular reflux; brisk carotid pulses without delay and no carotid bruits Chest: clear to auscultation, no signs of consolidation by percussion or palpation, normal fremitus, symmetrical and full respiratory excursions Cardiovascular: normal position and quality of the apical impulse, regular rhythm, normal first and second heart sounds, no murmurs, rubs or gallops Abdomen: no tenderness or distention, no masses by palpation, no  abnormal pulsatility or arterial bruits, normal bowel sounds, no hepatosplenomegaly Extremities: no clubbing, cyanosis or edema; 2+ radial, ulnar and brachial pulses bilaterally; 2+ right femoral, posterior tibial and dorsalis pedis pulses; 2+ left femoral, posterior tibial and dorsalis pedis pulses; no subclavian or femoral bruits Neurological: grossly nonfocal    EKG:  EKG is not ordered today. The ekg ordered February 4 demonstrates sinus rhythm with prominent respiratory sinus arrhythmia and a single PVC with left bundle branch block morphology, similar to those recorded on his Holter monitor. No evidence of epsilon wave or other findings concerning for ARVD   Recent Labs: 10/09/2013: ALT 11; BUN 14; Creatinine 0.99; Hemoglobin 14.7; Platelets 240; Potassium 4.3; Sodium 140; TSH 1.560     Wt Readings from Last 3 Encounters:  05/22/14 161 lb 8 oz (73.256 kg)  04/23/14 160 lb 6.4 oz (72.757 kg)  01/13/14 160 lb (72.576 kg)    ASSESSMENT AND PLAN:  1.  Willaim has satisfactory symptomatic control of frequent monomorphic PVCs. He is on a very low dose of beta blocker. This can be weaned down and even stopped if his symptoms are well controlled. Reviewed the importance of avoiding abrupt cessation of beta blocker therapy due to the risk of rebound. He seems to only need treatment during the stressful period of tax season when he misses a lot of sleep. He might do well with as needed beta blocker therapy alone.  We reviewed the fact that in the absence of structural cardiac abnormalities these PVCs are unlikely to represent a serious problem. We did review the very uncommon disorder of PVC related cardiomyopathy. ARVD is felt to be unlikely in the absence of multiple PVC morphologies and with a normal echocardiogram and ECG. As long as he remains asymptomatic while he is physically very active, I don't think this is a concern. Avoid stimulants. He should find a new provider in [place].  Encouraged to report presyncope or syncope or development of shortness of breath promptly.   Current medicines are reviewed at length with the patient today.  The patient does not have concerns regarding medicines.  The following changes have been made:  no change   Disposition:   FU with new provider in [place] after he moves   Joie Bimler, MD  05/22/2014 10:14 AM    [facility] [address] [place], [place], [place]  [address] Phone: [phone]; Fax: [phone]

## 2014-05-22 NOTE — Patient Instructions (Signed)
Dr. Royann Shiversroitoru recommends that you schedule a follow-up appointment in: One year or as needed if you are still living in town.

## 2014-07-10 NOTE — Op Note (Signed)
PATIENT NAME:  Bryan Carrillo, [name] MR#:  [id number] DATE OF BIRTH:  Jun 03, 1990  DATE OF PROCEDURE:  07/07/2012  PREOPERATIVE DIAGNOSIS: Acute appendicitis.   POSTOPERATIVE DIAGNOSIS: Acute early appendicitis.   PROCEDURE PERFORMED:  Laparoscopic appendectomy.   SURGEON: Natale LayMark Shatarra Wehling, M.D.   ASSISTANT: Scrub tech.  ANESTHESIA: General endotracheal.   FINDINGS: Early appendicitis.   SPECIMENS: Appendix to pathology.   ESTIMATED BLOOD LOSS:  Minimal.   DESCRIPTION OF PROCEDURE: Supine position, general anesthesia, sterile prep and drape of the abdomen and left arm padded and tucked at the side. Timeout was observed. A primary infraumbilical skin incision was fashioned with a scalpel and 12 mm blunt Hassan trocar was placed under direct visualization. Pneumoperitoneum was established. Limited laparoscopic evaluation of the abdomen demonstrated acute appendicitis. The patient was then positioned in Trendelenburg and airplane right side up. A 5 mm Bladeless trocar was placed in the right upper quadrant. An additional 5 mm trocar was placed in the left lower quadrant. The appendix appeared to be inflamed along its midportion to the tip. There were no purulent findings. It was not perforated.   Attachments of the appendix to the lateral pelvic sidewall were taken down with sharp technique and point cautery. A window was fashioned at the base of the appendix separating the mesoappendix and the base of the appendix. The base of the appendix was transected with a single fire of the endoscopic GIA stapler with blue load application. Mesoappendix was taken with separate fire of the white load of the stapler. The specimen was captured in an Endo Catch device and retrieved. The right lower quadrant was irrigated with 0.5 liters of warm normal saline and aspirated dry. Point hemostasis on the staple line of the appendix as well as the tip of the mesoappendix was achieved with point cautery. With hemostasis ensured on  the operative field, all fluid was aspirated. I examined the left upper quadrant as the patient had a history of splenomegaly. The spleen appeared to be unremarkable. It was not fully dissected out.   Ports were then removed under direct visualization. The infraumbilical fascial defect was reapproximated with a figure-of-eight #0 Vicryl suture in vertical orientation. The existing stay sutures tied to each other.   A total of 30 mL of 0.25% plain Marcaine was infiltrated along all skin and fascial incisions prior to closure. Benzoin, Steri-Strips, Telfa, and Tegaderm were then applied and the patient was subsequently extubated and taken to the recovery room in stable and satisfactory condition by anesthesia services.     ____________________________ Redge GainerMark A. Egbert GaribaldiBird, MD mab:cc D: 07/07/2012 14:40:03 ET T: 07/07/2012 17:37:17 ET JOB#: [id number]  cc: Loraine LericheMark A. Egbert GaribaldiBird, MD, <Dictator> Raynald KempMARK A Amandajo Gonder MD ELECTRONICALLY SIGNED 07/12/2012 9:36

## 2014-07-10 NOTE — Consult Note (Signed)
Brief Consult Note: Diagnosis: Pulmonary Vascular congestion, s\p lap app, tachycardia, RLQ pain.   Patient was seen by consultant.   Consult note dictated.   Orders entered.   Discussed with Attending MD.   Comments35: 24 yo male s\p lap app now with pulmonary vascular congestion vs pulmonary edema, called for medical consult  1. Pulmonary vascular congestion - most likley from fluids and anesthesia given during the procedure - noted to have pink frothy sputum after extubation, which has now resolved - recieved 40mg  IV lasix, output = 1500ml, no more lasix tonight, will give another 20mg  IV in the AM, fluid restriction to 1L\day - incentive spirometry, pain management to assist with spirometry  2. Tachycardia - sinus - most likely post op and due to pain - cont to monitor - pain control  3. RLQ pain  - s\p lap app - pain control  4. Substance abuse - hx of cocaine and marijuana use in the past - counseled on substance abuse  FULL CODE PMD - none (visits student health)  Time spent evaluating patient = 45 minutes  Job# [id number].  Electronic Signatures: Stephanie AcreMungal, Shadell Brenn (MD)  (Signed 20-Apr-14 18:22)  Authored: Brief Consult Note   Last Updated: 20-Apr-14 18:22 by Stephanie AcreMungal, Esmeralda Blanford (MD)

## 2014-07-10 NOTE — H&P (Signed)
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 20 hours ago the pain migrated up to his mid abdomen and yesterday was associated with a feverish feeling, nausea and chills. He did not take his temperature. He denies vomiting and anorexia and diarrhea. Last BM was yesterday and was normal. Last meal was a sandwich 15 hours ago.   Past Med/Surgical Hx:  denies:   ALLERGIES:  Amoxicillin: Unknown  Prednisone: Unknown  HOME MEDICATIONS: Medication Instructions Status  Advil 200 mg oral tablet 3 cap(s) orally prn  Active  multivitamin 1 tab(s) orally once a day Active   Family and Social History:  Family History Other  father had acute appendicitis   Social History negative tobacco, negative ETOH, See HPI   Place of Living [place]   Review of Systems:  Fever/Chills Yes   Cough No   Sputum No   Abdominal Pain Yes   Diarrhea No   Constipation No   Nausea/Vomiting Yes   SOB/DOE No   Chest Pain No   Dysuria No   Tolerating PT Yes   Tolerating Diet Yes  Nauseated   Physical Exam:  GEN well developed, well nourished   HEENT pink conjunctivae, PERRL, hearing intact to voice, moist oral mucosa, Oropharynx clear, good dentition   NECK supple  trachea midline   RESP normal resp effort  clear BS  no use of accessory muscles   CARD regular rate  no murmur  no JVD  no Rub   ABD positive tenderness  no liver/spleen enlargement  soft  tenderness to deep RLQ palpation but no rebound and no guarding   LYMPH negative neck   EXTR negative cyanosis/clubbing, negative edema   SKIN normal to palpation, skin turgor good   NEURO cranial nerves intact, negative tremor, follows commands, motor/sensory function intact   PSYCH alert, A+O to time, place, person   Additional Comments Discussed diagnosis and care plans with parents (in [place]) over phone. The patient's mother  plans to catch an early flight this AM and the patient's Barbaraann Rondo is on his way from [place], [place].   Lab Results: Hepatic:  19-Apr-14 16:57   Bilirubin, Total 1.0  Alkaline Phosphatase 80  SGPT (ALT) 23  SGOT (AST) 23  Total Protein, Serum  8.7  Albumin, Serum 4.8  Routine Chem:  19-Apr-14 16:57   Lipase 107 (Result(s) reported on 06 Jul 2012 at 08:29PM.)  Glucose, Serum  115  BUN 12  Creatinine (comp) 0.99  Sodium, Serum 139  Potassium, Serum 4.1  Chloride, Serum 104  CO2, Serum 32  Calcium (Total), Serum 9.6  Osmolality (calc) 278  eGFR (African American) >60  eGFR (Non-African American) >60 (eGFR values <8m/min/1.73 m2 may be an indication of chronic kidney disease (CKD). Calculated eGFR is useful in patients with stable renal function. The eGFR calculation will not be reliable in acutely ill patients when serum creatinine is changing rapidly. It is not useful in  patients on dialysis. The eGFR calculation may not be applicable to patients at the low and high extremes of body sizes, pregnant women, and vegetarians.)  Anion Gap  3  Routine UA:  19-Apr-14 16:56   Color (UA) Yellow  Clarity (UA) Hazy  Glucose (UA) Negative  Bilirubin (UA) Negative  Ketones (UA) Negative  Specific Gravity (UA) 1.018  Blood (UA) 1+  pH (UA) 7.0  Protein (UA) Negative  Nitrite (UA)  Negative  Leukocyte Esterase (UA) Negative (Result(s) reported on 06 Jul 2012 at 05:29PM.)  RBC (UA) 4 /HPF  WBC (UA) <1 /HPF  Bacteria (UA) NONE SEEN  Epithelial Cells (UA) NONE SEEN  Mucous (UA) PRESENT  Amorphous Crystal (UA) PRESENT (Result(s) reported on 06 Jul 2012 at 05:29PM.)  Routine Hem:  19-Apr-14 16:57   WBC (CBC) 5.7  RBC (CBC) 5.49  Hemoglobin (CBC) 16.4  Hematocrit (CBC) 46.4  Platelet Count (CBC) 310 (Result(s) reported on 06 Jul 2012 at 05:27PM.)  MCV 85  MCH 29.8  MCHC 35.3  RDW 12.8    Assessment/Admission Diagnosis CT scan w/ IV and PO contrast reveiwed with Lorenda Cahill, MD  w/ [facility]: thickened appendiceal wall with minimal stranding and dilation to about 10 mm. By my review, appendix is 12 mm and contains some air. When compared to CT April 2012 the appendix is defininitely changed acc. to Radiologist.  A - Early Acute Appendicitis   Plan Admit, IVF, IV ABx, laparoscopic appendectomy   Electronic Signatures: Consuela Mimes (MD)  (Signed 20-Apr-14 04:32)  Authored: CHIEF COMPLAINT and HISTORY, PAST MEDICAL/SURGIAL HISTORY, ALLERGIES, HOME MEDICATIONS, FAMILY AND SOCIAL HISTORY, REVIEW OF SYSTEMS, PHYSICAL EXAM, LABS, ASSESSMENT AND PLAN   Last Updated: 20-Apr-14 04:32 by Consuela Mimes (MD)

## 2014-07-10 NOTE — Consult Note (Signed)
PATIENT NAME:  Bryan Carrillo, Bryan Carrillo MR#:  [id number] DATE OF BIRTH:  11-04-1990  DATE OF CONSULTATION:  07/07/2012  REFERRING PHYSICIAN:  Dr. Egbert Garibaldi CONSULTING PHYSICIAN:  Stephanie Acre, MD  PRIMARY CARE PHYSICIAN: None.  He [facility] his student health care center at [facility].    CHIEF COMPLAINT: "My right abdomen was hurting me."  HISTORY OF PRESENT ILLNESS:   This is a 24 year old Caucasian male with no significant past medical history that was admitted for acute appendicitis. The patient states that about a day and a half to 2 days ago, he started noticing right lower quadrant pain, followed by nausea, vomiting, he was seen in the ED, had a CT scan done that showed acute appendicitis and was taken urgently surgery by Dr. Egbert Garibaldi. He had a laparoscopic appendectomy done today and received 1400 mL of IV fluids under general anesthesia. After this surgery was performed, he was extubated but noted to have or productive cough with frothy, pinkish sputum. He was given a total of 40 mg of Lasix and had an output of 1500 mL of urine. On chest x-ray was done that showed diffuse pulmonary edema with patchy pulmonary infiltrates consult. Medical consult ordered for further inpatient work-up and management by hospitalist.   PAST MEDICAL HISTORY: Substance abuse, cocaine, marijuana.   MEDICATIONS: Currently none  prior to admission.  PAST SURGICAL HISTORY:  None  prior to admission.   ALLERGIES: POSITIVE SHORTNESS OF BREATH ANAPHYLAXIS TO AMOXICILLIN.   FAMILY HISTORY: Not significant.   SOCIAL HISTORY: Occasional smoker, occasional drinker of beer and liquor, occasional drug abuse. Cocaine use 2 months ago for the first time, marijuana on occasion and cigars on occasion. 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 He does not work, at this time.    REVIEW OF SYSTEMS: After surgery is as follows:   CONSTITUTIONAL:  No fever, fatigue. Positive weakness and positive pain in the right lower quadrant. Otherwise:   EYES: No blurred vision, double vision, pain or redness.  ENT: No tinnitus, ear pain, hearing loss or seasonal allergies.  RESPIRATORY: Mild cough, no wheeze, no dyspnea. Positive painful respirations, no chronic obstructive pulmonary disease, tuberculosis or pneumonia.  CARDIOVASCULAR: No chest pain, orthopnea, edema, arrhythmia  or dyspnea on exertion.  GASTROINTESTINAL: Currently no nausea, vomiting, diarrhea. Positive right lower quadrant abdominal pain. No melena or ulcers or GERD.  GENITOURINARY: No dysuria, hematuria, renal calculi.  ENDOCRINE:  No polyuria, nocturia or thyroid problems.  HEMATOLOGIC/LYMPHATIC: No easy bruising, bleeding or swollen glands.  INTEGUMENTARY: No acne, rash,  lesions, or change in mole, hair or skin.  MUSCULOSKELETAL: No pain in neck, back, shoulders, knees, or hips.   NEUROLOGIC: No numbness, weakness, dysarthria, epilepsy, tremor or vertigo.  PSYCHIATRIC: No anxiety, insomnia, ADD, OCD, bipolar, depression, schizophrenia or nervousness.   PHYSICAL EXAMINATION: As follows:  VITAL SIGNS:  As follows:  Temperature 97.4, pulse 63, respirations 18, blood pressure 109/65, pulse oximetry 97% on room air.  GENERAL APPEARANCE: Well-developed, well-nourished male in no acute respiratory distress.  HEENT: Pupils are equal, round and reactive to light and accommodation, extraocular movements are intact no scleral icterus, conjunctivitis, difficulty hearing. TMs are intact.  NECK: No thyroid enlargement or nodules. Neck is nontender and supple. No JVD is noted. No carotid bruits.  RESPIRATORY:  Good respiratory effort. Clear upper airway sounds. However, there are diffuse crackles at the bilateral base bases and there are mildly diminished breath sounds at the bilateral bases. No use of accessory muscles.  CARDIOVASCULAR: Mild tachycardia  noted; otherwise, normal rhythm. S1, S2 is auscultated. No lower extremity edema. No PMI lateralization.  CHEST: Nontender.   ABDOMEN: Is currently soft. He does have a dressing noted in the right lower quadrant. No drainage is noted at this time.  MUSCULOSKELETAL: There is 5/5 strength bilateral upper and lower extremities. No cyanosis, degenerative joint disease or kyphosis.  SKIN: No rash, lesions, erythema, nodules or induration. Skin is warm and dry.   LYMPHATIC: No adenopathy noted in the cervical, axilla or supraclavicular regions.  NEUROLOGIC: Cranial nerves II through XII intact. Deep tendon reflex reflexes are intact.  PSYCHIATRIC: Alert oriented to person, time and place. Cooperative. Good judgment is noted.   LABORATORY, DIAGNOSTIC AND RADIOLOGIC DATA:  Are as follows post laparoscopic appendectomy. There is bilateral diffuse patchy infiltrates are present. There is no effusion. There is no cardiomegaly. There is no pneumothorax. There is no discrete mass. The bony and mediastinal structures appear normal. Diffuse pulmonary edema versus patchy infiltrates is noted.   Pertinent labs: He had a CBC this morning that showed a white cell count 6.1, hemoglobin 14.0, hematocrit 39.1 and a platelet count 240. His last metabolic profile was done on April 19, that showed sodium 139, potassium 4.1, chloride 104, bicarbonate 32, BUN 12, creatinine 0.99, glucose 115, total serum protein 8.7.   ASSESSMENT AND PLAN: This is a 24 year old male status post laparoscopic appendectomy, now with pulmonary vascular congestion versus pulmonary interstitial edema.  1.  Pulmonary vascular congestion. Most likely from fluids and anesthesia given during the procedure. He may have some atelectasis that is causing to this also status post surgery. He was noted to have pink frothy sputum after extubation, which is now resolved. He did receive 40 mg of IV Lasix and put out 1500 amount of urine. We will not give him any more Lasix tonight. We will schedule 20 mg of Lasix in the morning. Fluid restriction to 1 liter a day. Incentive spirometry.  We will advise adequate pain control and pain management he can perform incentive spirometry and keep his lungs open.  2.  Tachycardia. This is most likely sinus related to pain in his right lower quadrant from his laparoscopic appendectomy. We will continue to monitor pain control.  3.  Right lower quadrant pain status post laparoscopic appendectomy. Pain control.  4.  Substance abuse. History of cocaine, marijuana use in the past. Consulted on substance abuse.    CODE STATUS:  The patient is a full code.    TIME SPENT DICTATING AND EVALUATING PATIENT: 45 minutes.    ____________________________ Stephanie AcreVishal Analisia Kingsford, MD vm:cc D: 07/07/2012 18:20:31 ET T: 07/07/2012 20:23:04 ET JOB#: [id number]  cc: Stephanie AcreVishal Mc Bloodworth, MD, <Dictator> Stephanie AcreVISHAL Osmar Howton MD ELECTRONICALLY SIGNED 07/07/2012 22:29

## 2014-07-10 NOTE — Discharge Summary (Signed)
PATIENT NAME:  Bryan Carrillo, Fadil MR#:  [id number] DATE OF BIRTH:  06-21-1990  DATE OF ADMISSION:  07/07/2012  DATE OF DISCHARGE:  07/09/2012   FINAL DIAGNOSES: 1.  Early acute appendicitis.  2.  Postoperative pulmonary edema thought secondary to negative pressure phenomenon.   PRINCIPLE PROCEDURE:  1.  Laparoscopic appendectomy.  2.  Medicine consultation featuring [facility].  3.  CT scan of the abdomen.  4.  Multiple chest x-rays.   HOSPITAL COURSE SUMMARY:  The patient was admitted with acute appendicitis by Dr. Anda KraftMarterre early on the morning of the 20th of April.   He was taken to the operating room later that day by Dr. Egbert GaribaldiBird for laparoscopic appendectomy. Postoperatively, the patient had frothy, pink sputum in the PACU requiring high-dose oxygen and a chest x-ray, which demonstrated diffuse pulmonary edema. He was treated with intravenous Lasix on several doses. His oxygen requirement diminished, as well as his shortness of breath. [facility]. The patient seemed to improve over the course of the next 24 hours. The abdomen is soft and nontender. His chest x-ray improved. He was tolerating a regular diet. By postoperative day #2, the patient was stable and improved for discharge, feeling better without any oxygen requirements.   DISCHARGE MEDICATIONS: Advil 200 mg by mouth 3 times a day as needed, multivitamin 1 tab by mouth once a day, acetaminophen/hydrocodone 5/325, 1 tab every 4 hours as needed for pain.   DISCHARGE FOLLOWUP:  He will follow up in my office in approximately 1 week's time. Call with any questions or concerns.    ____________________________ Redge GainerMark A. Egbert GaribaldiBird, MD mab:dmm D: 07/16/2012 21:55:00 ET T: 07/16/2012 23:00:57 ET JOB#: [id number]  cc: Loraine LericheMark A. Egbert GaribaldiBird, MD, <Dictator> Arvie Villarruel A Omaira Mellen MD ELECTRONICALLY SIGNED 07/17/2012 20:35

## 2014-12-22 IMAGING — CR DG CLAVICLE*L*
2 series · 2 of 2 positions shown · non-contrast
Comparison: None.

CLINICAL DATA: Left collarbone pain for 3 weeks, no known injury

EXAM:
LEFT CLAVICLE - 2+ VIEWS

[view not recorded (1 of 2)]
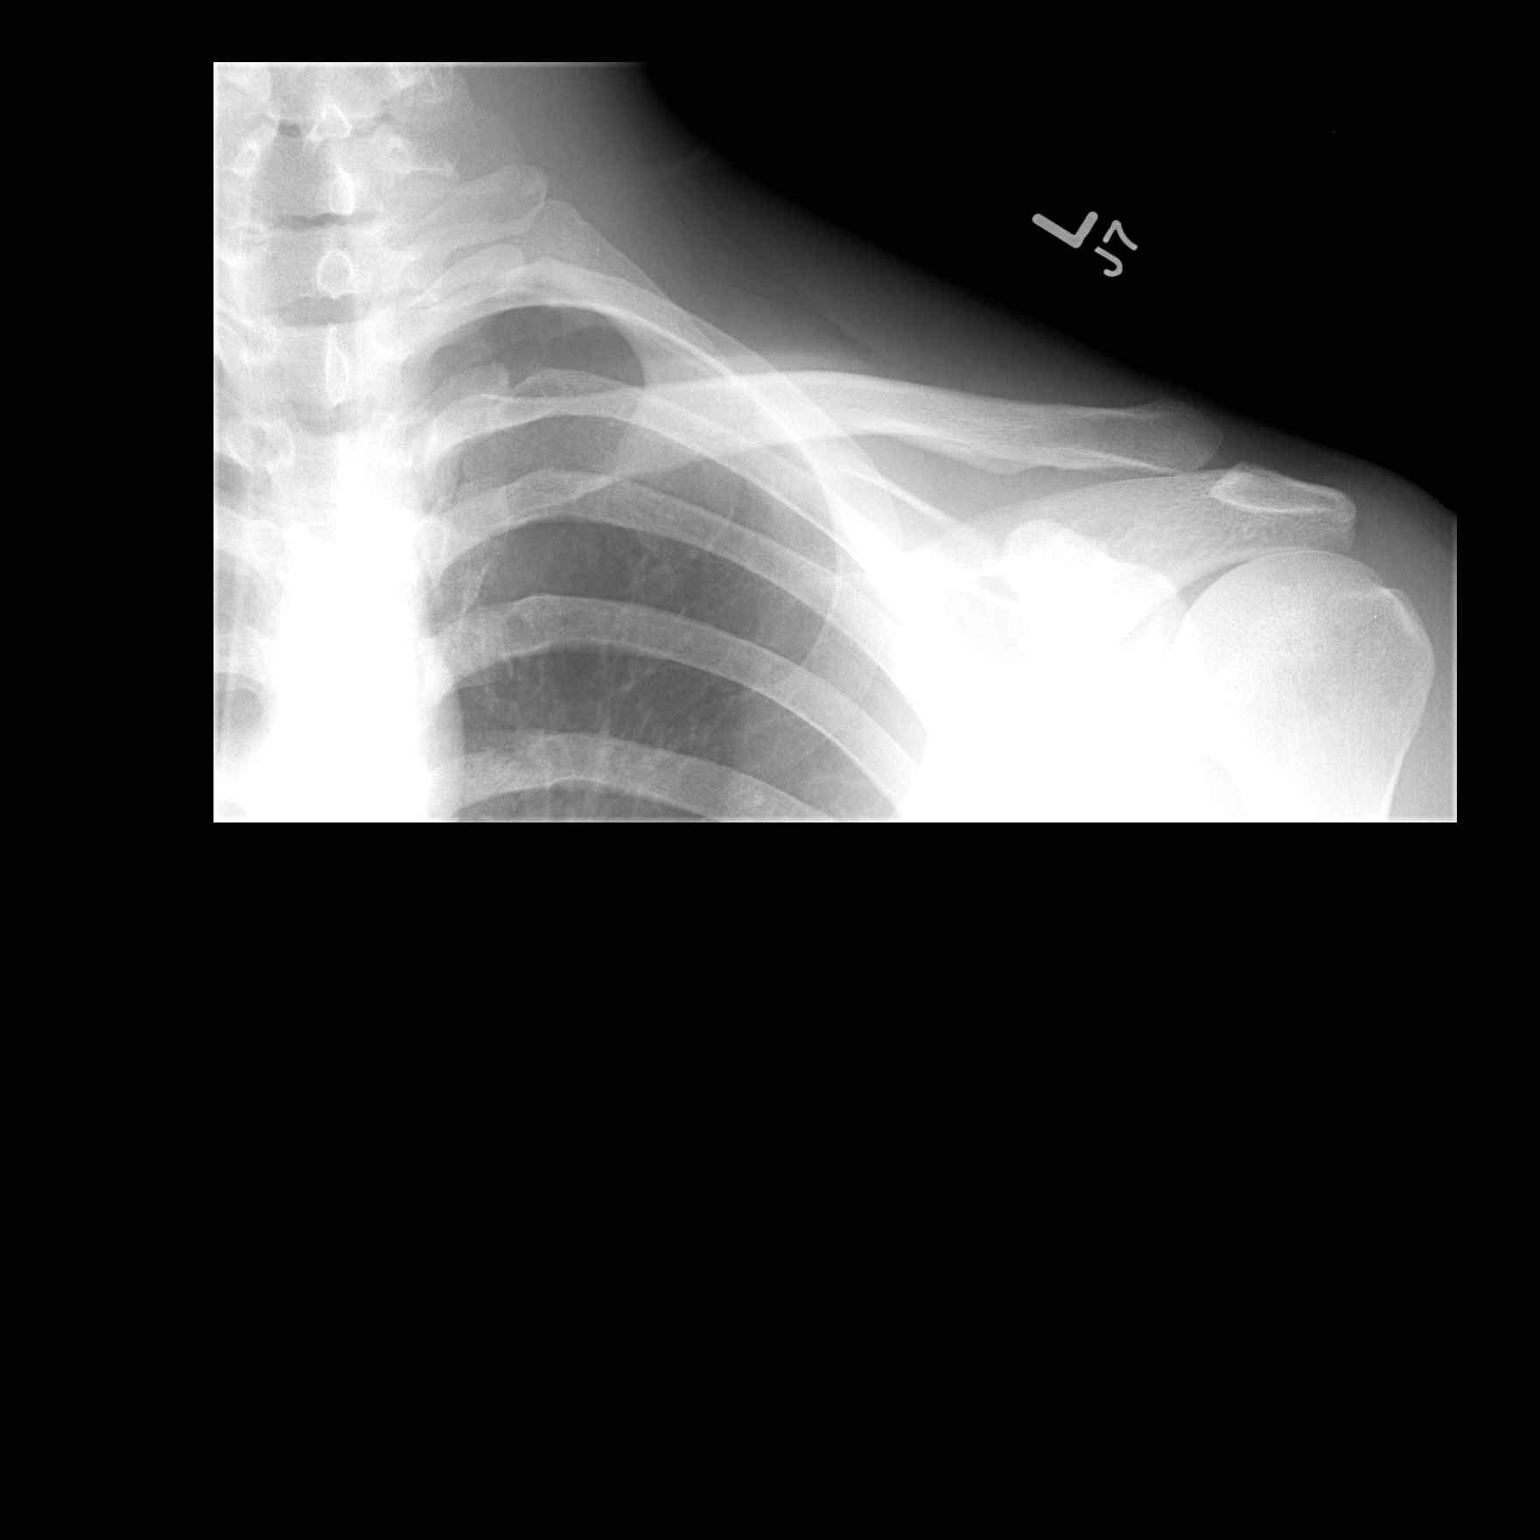

[view not recorded (2 of 2)]
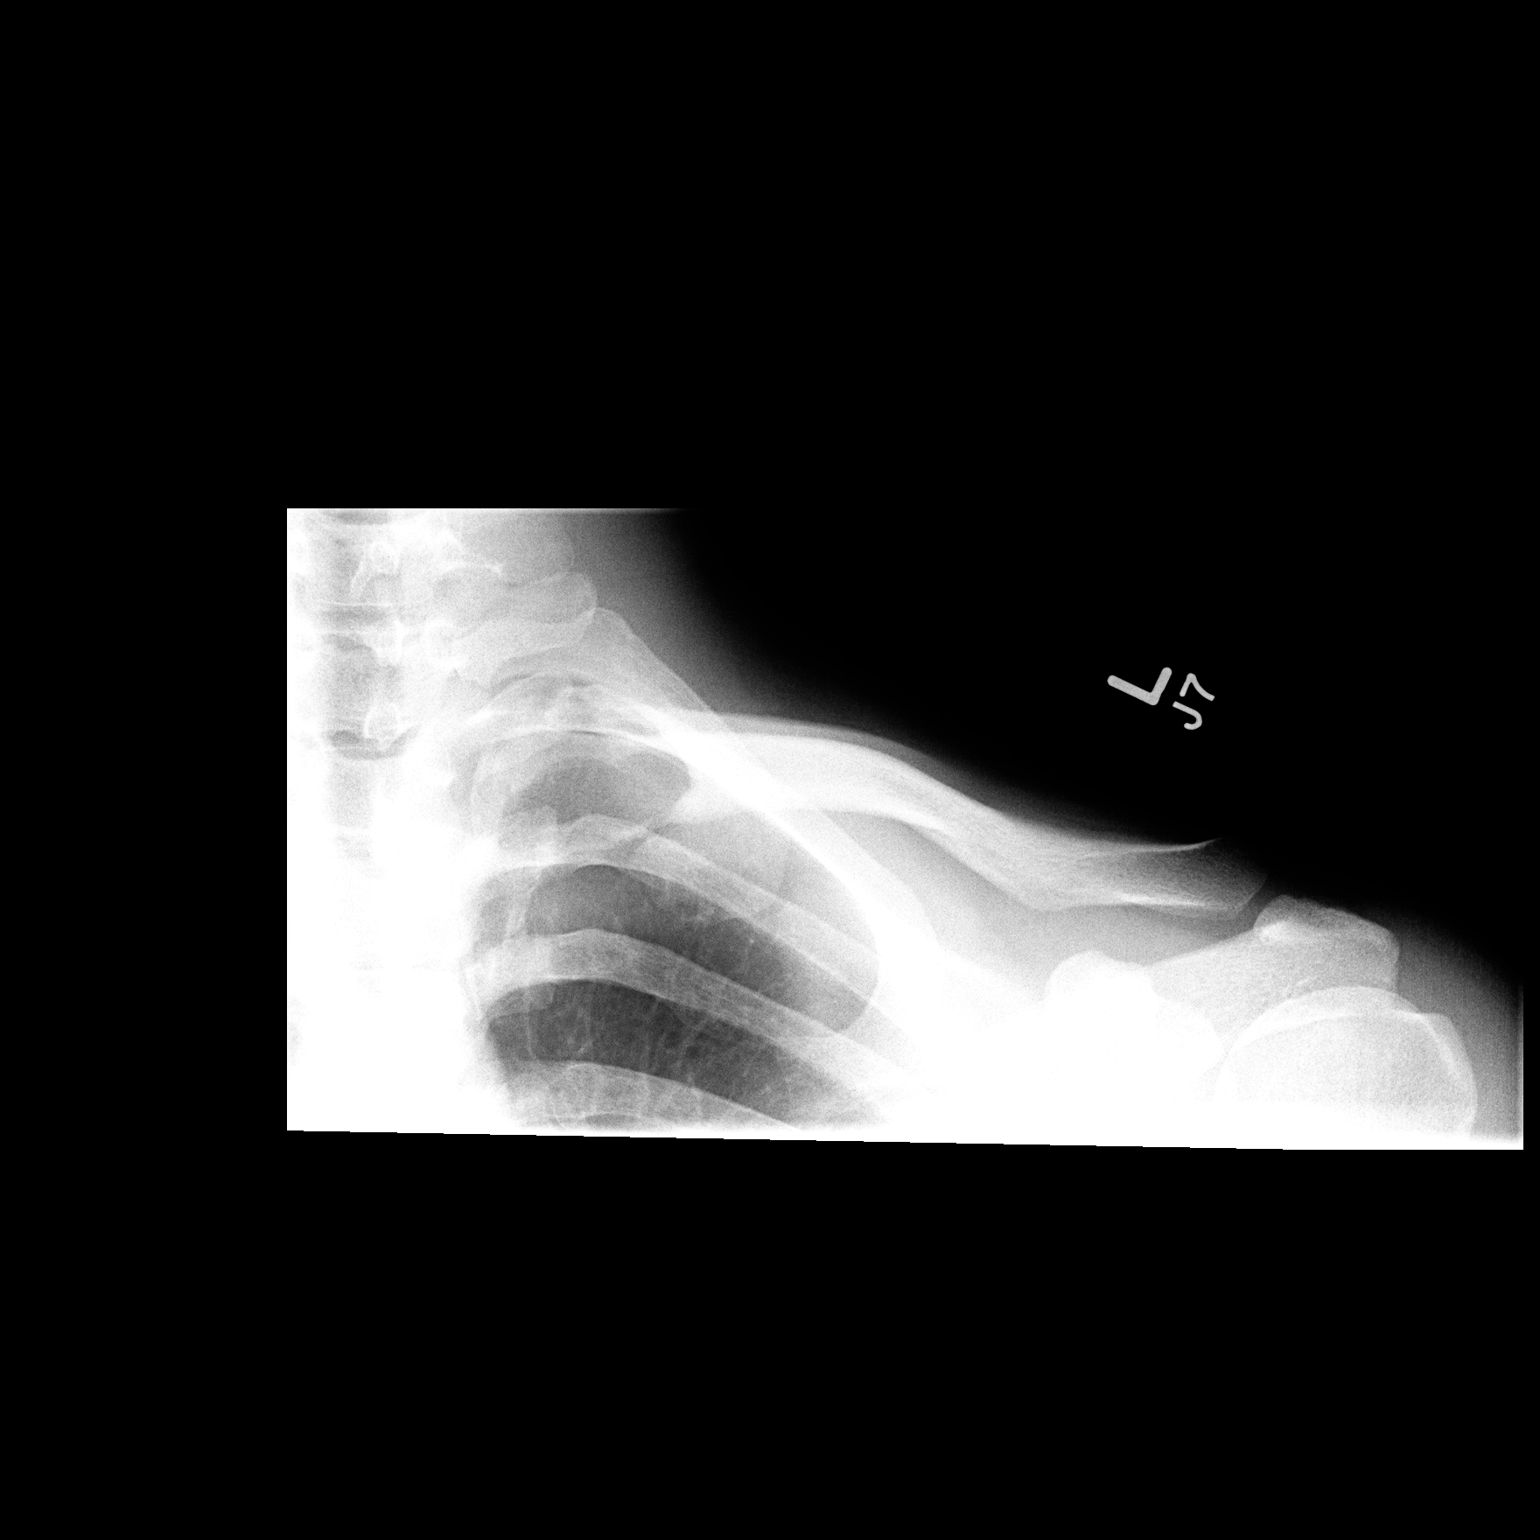

[2 of 2 positions shown; findings below may reference images not displayed]

FINDINGS: No fracture or dislocation. No definite aggressive osseous lesions.
Limited visualization of the adjacent acromioclavicular joint is
normal. Normal coracoclavicular distance is observed on both
provided radiographs. Regional soft tissues appear normal. Limited
visualization of the left lung apex is normal.
IMPRESSION: Normal radiographs of the left clavicle.
